# Patient Record
Sex: Female | Born: 1945
Health system: Southern US, Community
[De-identification: ages and names within clinical notes are randomized; demographics above are authoritative.]

## PROBLEM LIST (undated history)

## (undated) DIAGNOSIS — R002 Palpitations: Secondary | ICD-10-CM

## (undated) DIAGNOSIS — K219 Gastro-esophageal reflux disease without esophagitis: Secondary | ICD-10-CM

## (undated) DIAGNOSIS — M810 Age-related osteoporosis without current pathological fracture: Secondary | ICD-10-CM

## (undated) HISTORY — DX: Age-related osteoporosis without current pathological fracture: M81.0

## (undated) HISTORY — DX: Gastro-esophageal reflux disease without esophagitis: K21.9

## (undated) HISTORY — DX: Palpitations: R00.2

---

## 1998-10-12 ENCOUNTER — Other Ambulatory Visit: Admission: RE | Admit: 1998-10-12 | Discharge: 1998-10-12 | Payer: Self-pay | Admitting: Gynecology

## 1999-10-21 ENCOUNTER — Other Ambulatory Visit: Admission: RE | Admit: 1999-10-21 | Discharge: 1999-10-21 | Payer: Self-pay | Admitting: Gynecology

## 2000-10-31 ENCOUNTER — Other Ambulatory Visit: Admission: RE | Admit: 2000-10-31 | Discharge: 2000-10-31 | Payer: Self-pay | Admitting: Gynecology

## 2002-08-01 ENCOUNTER — Other Ambulatory Visit: Admission: RE | Admit: 2002-08-01 | Discharge: 2002-08-01 | Payer: Self-pay | Admitting: *Deleted

## 2003-07-21 ENCOUNTER — Other Ambulatory Visit: Admission: RE | Admit: 2003-07-21 | Discharge: 2003-07-21 | Payer: Self-pay

## 2004-07-20 ENCOUNTER — Other Ambulatory Visit: Admission: RE | Admit: 2004-07-20 | Discharge: 2004-07-20 | Payer: Self-pay | Admitting: Obstetrics and Gynecology

## 2005-11-09 ENCOUNTER — Other Ambulatory Visit: Admission: RE | Admit: 2005-11-09 | Discharge: 2005-11-09 | Payer: Self-pay | Admitting: Obstetrics and Gynecology

## 2006-12-04 ENCOUNTER — Other Ambulatory Visit: Admission: RE | Admit: 2006-12-04 | Discharge: 2006-12-04 | Payer: Self-pay | Admitting: Obstetrics and Gynecology

## 2007-12-05 ENCOUNTER — Other Ambulatory Visit: Admission: RE | Admit: 2007-12-05 | Discharge: 2007-12-05 | Payer: Self-pay | Admitting: Obstetrics and Gynecology

## 2010-03-30 ENCOUNTER — Ambulatory Visit: Payer: Self-pay | Admitting: Cardiology

## 2010-09-05 ENCOUNTER — Encounter: Payer: Self-pay | Admitting: Cardiology

## 2015-09-23 DIAGNOSIS — M810 Age-related osteoporosis without current pathological fracture: Secondary | ICD-10-CM | POA: Diagnosis not present

## 2015-09-23 DIAGNOSIS — Z0000 Encounter for general adult medical examination without abnormal findings: Secondary | ICD-10-CM | POA: Diagnosis not present

## 2015-09-29 DIAGNOSIS — Z13220 Encounter for screening for lipoid disorders: Secondary | ICD-10-CM | POA: Diagnosis not present

## 2015-09-29 DIAGNOSIS — M810 Age-related osteoporosis without current pathological fracture: Secondary | ICD-10-CM | POA: Diagnosis not present

## 2015-09-29 DIAGNOSIS — E559 Vitamin D deficiency, unspecified: Secondary | ICD-10-CM | POA: Diagnosis not present

## 2015-09-29 DIAGNOSIS — Z0000 Encounter for general adult medical examination without abnormal findings: Secondary | ICD-10-CM | POA: Diagnosis not present

## 2015-09-29 DIAGNOSIS — N390 Urinary tract infection, site not specified: Secondary | ICD-10-CM | POA: Diagnosis not present

## 2015-10-06 DIAGNOSIS — Z23 Encounter for immunization: Secondary | ICD-10-CM | POA: Diagnosis not present

## 2016-04-27 DIAGNOSIS — Z23 Encounter for immunization: Secondary | ICD-10-CM | POA: Diagnosis not present

## 2016-04-27 DIAGNOSIS — R490 Dysphonia: Secondary | ICD-10-CM | POA: Diagnosis not present

## 2016-07-22 DIAGNOSIS — Z1231 Encounter for screening mammogram for malignant neoplasm of breast: Secondary | ICD-10-CM | POA: Diagnosis not present

## 2016-07-22 DIAGNOSIS — Z803 Family history of malignant neoplasm of breast: Secondary | ICD-10-CM | POA: Diagnosis not present

## 2016-08-04 DIAGNOSIS — L661 Lichen planopilaris: Secondary | ICD-10-CM | POA: Diagnosis not present

## 2016-08-04 DIAGNOSIS — L814 Other melanin hyperpigmentation: Secondary | ICD-10-CM | POA: Diagnosis not present

## 2016-08-04 DIAGNOSIS — L570 Actinic keratosis: Secondary | ICD-10-CM | POA: Diagnosis not present

## 2016-08-24 DIAGNOSIS — K219 Gastro-esophageal reflux disease without esophagitis: Secondary | ICD-10-CM | POA: Diagnosis not present

## 2016-08-24 DIAGNOSIS — R490 Dysphonia: Secondary | ICD-10-CM | POA: Diagnosis not present

## 2016-08-24 DIAGNOSIS — J310 Chronic rhinitis: Secondary | ICD-10-CM | POA: Diagnosis not present

## 2016-09-06 DIAGNOSIS — J310 Chronic rhinitis: Secondary | ICD-10-CM | POA: Diagnosis not present

## 2016-10-05 DIAGNOSIS — E559 Vitamin D deficiency, unspecified: Secondary | ICD-10-CM | POA: Diagnosis not present

## 2016-10-05 DIAGNOSIS — R490 Dysphonia: Secondary | ICD-10-CM | POA: Diagnosis not present

## 2016-10-05 DIAGNOSIS — K219 Gastro-esophageal reflux disease without esophagitis: Secondary | ICD-10-CM | POA: Diagnosis not present

## 2016-10-05 DIAGNOSIS — Z0000 Encounter for general adult medical examination without abnormal findings: Secondary | ICD-10-CM | POA: Diagnosis not present

## 2016-10-07 ENCOUNTER — Ambulatory Visit (INDEPENDENT_AMBULATORY_CARE_PROVIDER_SITE_OTHER): Payer: PPO | Admitting: Obstetrics & Gynecology

## 2016-10-07 ENCOUNTER — Encounter: Payer: Self-pay | Admitting: Obstetrics & Gynecology

## 2016-10-07 VITALS — BP 110/76 | HR 68 | Resp 12 | Ht 64.0 in | Wt 148.4 lb

## 2016-10-07 DIAGNOSIS — M8580 Other specified disorders of bone density and structure, unspecified site: Secondary | ICD-10-CM

## 2016-10-07 DIAGNOSIS — Z01419 Encounter for gynecological examination (general) (routine) without abnormal findings: Secondary | ICD-10-CM

## 2016-10-07 DIAGNOSIS — Z124 Encounter for screening for malignant neoplasm of cervix: Secondary | ICD-10-CM | POA: Diagnosis not present

## 2016-10-07 NOTE — Progress Notes (Signed)
71 y.o. G2P2 MarriedCaucasianF here for annual exam.  Former patient of Dr. Joan Flores.  Has been having her pelvic exams with her PCP--Dr. Shelia Media or Valerie Galvan in his office.  Denies any issues or concerns.  Denies vaginal bleeding.  Had blood work done last week with Dr. Pennie Banter office.  Daughter had baby in January.  They live in [place].  No LMP recorded. Patient is postmenopausal.          Sexually active: No.  The current method of family planning is post menopausal status.    Exercising: Yes.    walking, yoga Smoker:  Former smoker   Health Maintenance: Pap: 2 years ago at Dr. Pennie Banter office  History of abnormal Pap:  no MMG:  2018 at [facility]  Colonoscopy: patient states she is due.  Had appt with Dr. Earlean Shawl but rescheduled due daughter's pregnacy BMD:   ~2 years ago at Dr. Pennie Banter office  TDaP:  Valerie Galvan, will check with PCP  Pneumonia vaccine(s):  PCP Zostavax:   PCP Hep C testing: will check with PCP  Screening Labs: PCP, Hb today: PCP   reports that she has quit smoking. Her smoking use included Cigarettes. She has never used smokeless tobacco. She reports that she drinks alcohol. She reports that she does not use drugs.  Past Medical History:  Diagnosis Date  . Heart palpitations   . Osteoporosis     History reviewed. No pertinent surgical history.  Current Outpatient Prescriptions  Medication Sig Dispense Refill  . BIOTIN PO Take by mouth daily.    Marland Kitchen CALCIUM-VITAMIN D PO Take by mouth daily.    . Multiple Vitamins-Minerals (MULTIVITAMIN PO) Take by mouth daily.    . pantoprazole (PROTONIX) 40 MG tablet     . Probiotic Product (PROBIOTIC PO) Take by mouth daily.     No current facility-administered medications for this visit.     Family History  Problem Relation Age of Onset  . Valvular heart disease Mother   . Breast cancer Mother 23    diagnosed again in her 37's  . Heart disease Father   . Rheum arthritis Father   . Skin cancer Brother   . Cancer Paternal  Grandmother     tongue     ROS:  Pertinent items are noted in HPI.  Otherwise, a comprehensive ROS was negative.  Exam:   BP 110/76 (BP Location: Right Arm, Patient Position: Sitting, Cuff Size: Normal)   Pulse 68   Resp 12   Ht 5\' 4"  (1.626 m)   Wt 148 lb 6.4 oz (67.3 kg)   BMI 25.47 kg/m    Height: 5\' 4"  (162.6 cm)  Ht Readings from Last 3 Encounters:  10/07/16 5\' 4"  (1.626 m)    General appearance: alert, cooperative and appears stated age Head: Normocephalic, without obvious abnormality, atraumatic Neck: no adenopathy, supple, symmetrical, trachea midline and thyroid normal to inspection and palpation Lungs: clear to auscultation bilaterally Breasts: normal appearance, no masses or tenderness Heart: regular rate and rhythm Abdomen: soft, non-tender; bowel sounds normal; no masses,  no organomegaly Extremities: extremities normal, atraumatic, no cyanosis or edema Skin: Skin color, texture, turgor normal. No rashes or lesions Lymph nodes: Cervical, supraclavicular, and axillary nodes normal. No abnormal inguinal nodes palpated Neurologic: Grossly normal   Pelvic: External genitalia:  no lesions              Urethra:  normal appearing urethra with no masses, tenderness or lesions  Bartholins and Skenes: normal                 Vagina: normal appearing vagina with normal color and discharge, no lesions, second degree uterine prolapse and cystocele              Cervix: no lesions              Pap taken: Yes.   Bimanual Exam:  Uterus:  normal size, contour, position, consistency, mobility, non-tender              Adnexa: normal adnexa and no mass, fullness, tenderness               Rectovaginal: Confirms               Anus:  normal sphincter tone, no lesions  Chaperone was present for exam.  A:  Well Woman with normal exam PMP, no HRT Family hx of breast cancer Second degree uterine prolapse and cystocele  P:   Mammogram guidelines reviewed   Doing 3D  mammogram. pap smear updated today Will get vaccines to update records Lab work was done last week.  She will check about having the Hep C testing done return annually or prn

## 2016-10-07 NOTE — Patient Instructions (Signed)
Check about whether you've had the Hepatitis C testing

## 2016-10-10 LAB — IPS PAP SMEAR ONLY

## 2016-10-12 DIAGNOSIS — E7800 Pure hypercholesterolemia, unspecified: Secondary | ICD-10-CM | POA: Diagnosis not present

## 2016-10-12 DIAGNOSIS — M810 Age-related osteoporosis without current pathological fracture: Secondary | ICD-10-CM | POA: Diagnosis not present

## 2016-10-12 DIAGNOSIS — E559 Vitamin D deficiency, unspecified: Secondary | ICD-10-CM | POA: Diagnosis not present

## 2016-10-12 DIAGNOSIS — Z0000 Encounter for general adult medical examination without abnormal findings: Secondary | ICD-10-CM | POA: Diagnosis not present

## 2017-06-07 DIAGNOSIS — Z23 Encounter for immunization: Secondary | ICD-10-CM | POA: Diagnosis not present

## 2017-08-23 DIAGNOSIS — Z1231 Encounter for screening mammogram for malignant neoplasm of breast: Secondary | ICD-10-CM | POA: Diagnosis not present

## 2017-08-23 DIAGNOSIS — Z803 Family history of malignant neoplasm of breast: Secondary | ICD-10-CM | POA: Diagnosis not present

## 2017-09-27 DIAGNOSIS — Z23 Encounter for immunization: Secondary | ICD-10-CM | POA: Diagnosis not present

## 2017-10-11 DIAGNOSIS — E7800 Pure hypercholesterolemia, unspecified: Secondary | ICD-10-CM | POA: Diagnosis not present

## 2017-10-11 DIAGNOSIS — E559 Vitamin D deficiency, unspecified: Secondary | ICD-10-CM | POA: Diagnosis not present

## 2017-10-11 DIAGNOSIS — N390 Urinary tract infection, site not specified: Secondary | ICD-10-CM | POA: Diagnosis not present

## 2017-10-11 DIAGNOSIS — Z0000 Encounter for general adult medical examination without abnormal findings: Secondary | ICD-10-CM | POA: Diagnosis not present

## 2017-11-07 DIAGNOSIS — E559 Vitamin D deficiency, unspecified: Secondary | ICD-10-CM | POA: Diagnosis not present

## 2017-11-07 DIAGNOSIS — M810 Age-related osteoporosis without current pathological fracture: Secondary | ICD-10-CM | POA: Diagnosis not present

## 2017-11-07 DIAGNOSIS — Z0000 Encounter for general adult medical examination without abnormal findings: Secondary | ICD-10-CM | POA: Diagnosis not present

## 2018-01-11 ENCOUNTER — Ambulatory Visit: Payer: PPO | Admitting: Obstetrics & Gynecology

## 2018-03-08 ENCOUNTER — Other Ambulatory Visit: Payer: Self-pay

## 2018-03-08 ENCOUNTER — Ambulatory Visit (INDEPENDENT_AMBULATORY_CARE_PROVIDER_SITE_OTHER): Payer: PPO | Admitting: Obstetrics & Gynecology

## 2018-03-08 ENCOUNTER — Encounter: Payer: Self-pay | Admitting: Obstetrics & Gynecology

## 2018-03-08 VITALS — BP 126/80 | HR 76 | Resp 16 | Ht 64.0 in | Wt 151.0 lb

## 2018-03-08 DIAGNOSIS — Z01419 Encounter for gynecological examination (general) (routine) without abnormal findings: Secondary | ICD-10-CM | POA: Diagnosis not present

## 2018-03-08 NOTE — Progress Notes (Signed)
72 y.o. G2P2 MarriedCaucasianF here for annual exam. Doing well.  Denies vaginal bleeding.    Has been referred to Dr. Earlean Shawl for repeat colonoscopy.    Lab work done early this year with Dr. Milton Ferguson.    Patient's last menstrual period was 08/15/1996 (approximate).          Sexually active: No.  The current method of family planning is post menopausal status.    Exercising: Yes.    walking, yoga Smoker:  no  Health Maintenance: Pap:  10/07/16 neg  History of abnormal Pap:  no MMG:  07/22/16 BIRADS1:Neg  Colonoscopy: ~2000 normal.  Has been recommended BMD:  Unsure.  Will plan to do this with mammogram in December TDaP:  2011 Pneumonia vaccine(s):  2014 Shingrix: Zostavax.   Hep C testing: PCP Screening Labs: PCP   reports that she has quit smoking. Her smoking use included cigarettes. She has never used smokeless tobacco. She reports that she drinks about 2.4 oz of alcohol per week. She reports that she does not use drugs.  Past Medical History:  Diagnosis Date  . Heart palpitations   . Osteoporosis     History reviewed. No pertinent surgical history.  Current Outpatient Medications  Medication Sig Dispense Refill  . BIOTIN PO Take by mouth daily.    Marland Kitchen CALCIUM-VITAMIN D PO Take by mouth daily.    . Cimetidine (ACID REDUCER 200 PO) Take by mouth daily as needed.    . Multiple Vitamins-Minerals (MULTIVITAMIN PO) Take by mouth daily.    . Probiotic Product (PROBIOTIC PO) Take by mouth daily.     No current facility-administered medications for this visit.     Family History  Problem Relation Age of Onset  . Valvular heart disease Mother   . Breast cancer Mother 27       diagnosed again in her 39's  . Heart disease Father   . Rheum arthritis Father   . Skin cancer Brother   . Cancer Paternal Grandmother        tongue     Review of Systems  All other systems reviewed and are negative.   Exam:   BP 126/80 (BP Location: Right Arm, Patient Position: Sitting, Cuff  Size: Normal)   Pulse 76   Resp 16   Ht 5\' 4"  (1.626 m)   Wt 151 lb (68.5 kg)   LMP 08/15/1996 (Approximate)   BMI 25.92 kg/m     Height: 5\' 4"  (162.6 cm)  Ht Readings from Last 3 Encounters:  03/08/18 5\' 4"  (1.626 m)  10/07/16 5\' 4"  (1.626 m)    General appearance: alert, cooperative and appears stated age Head: Normocephalic, without obvious abnormality, atraumatic Neck: no adenopathy, supple, symmetrical, trachea midline and thyroid normal to inspection and palpation Lungs: clear to auscultation bilaterally Breasts: normal appearance, no masses or tenderness Heart: regular rate and rhythm Abdomen: soft, non-tender; bowel sounds normal; no masses,  no organomegaly Extremities: extremities normal, atraumatic, no cyanosis or edema Skin: Skin color, texture, turgor normal. No rashes or lesions Lymph nodes: Cervical, supraclavicular, and axillary nodes normal. No abnormal inguinal nodes palpated Neurologic: Grossly normal   Pelvic: External genitalia:  no lesions              Urethra:  normal appearing urethra with no masses, tenderness or lesions              Bartholins and Skenes: normal  Vagina: normal appearing vagina with normal color and discharge, no lesions              Cervix: no lesions              Pap taken: No. Bimanual Exam:  Uterus:  normal size, contour, position, consistency, mobility, non-tender              Adnexa: normal adnexa and no mass, fullness, tenderness               Rectovaginal: Confirms               Anus:  normal sphincter tone, no lesions  Chaperone was present for exam.  A:  Well Woman with normal exam PMP, no HRT Family hx of breast cancer Second degree uterine prolapse and cystocele  P:   Mammogram guidelines reviewed.  Release of records signed for last MMG. Order sent to [facility] for BMD.  She will do these at the same time. pap smear 2018 new.  Will repeat 1-2 years per pt desires Lab work done in [place] with Dr.  Shelia Media Shingrix vaccinations reviewed.  Declines for now. Return annually or prn

## 2018-06-28 DIAGNOSIS — Z23 Encounter for immunization: Secondary | ICD-10-CM | POA: Diagnosis not present

## 2019-06-25 DIAGNOSIS — Z23 Encounter for immunization: Secondary | ICD-10-CM | POA: Diagnosis not present

## 2019-07-02 ENCOUNTER — Ambulatory Visit: Payer: PPO | Admitting: Obstetrics & Gynecology

## 2019-09-05 ENCOUNTER — Ambulatory Visit: Payer: Self-pay | Attending: Internal Medicine

## 2019-09-05 ENCOUNTER — Ambulatory Visit: Payer: Self-pay

## 2019-09-05 DIAGNOSIS — Z23 Encounter for immunization: Secondary | ICD-10-CM | POA: Insufficient documentation

## 2019-09-05 NOTE — Progress Notes (Signed)
   Covid-19 Vaccination Clinic  Name:  HARRIS MCQUARY    MRN: [id number] DOB: April 20, 1946  09/05/2019  Ms. Frye was observed post Covid-19 immunization for 15 minutes without incidence. She was provided with Vaccine Information Sheet and instruction to access the V-Safe system.   Ms. Rentas was instructed to call 911 with any severe reactions post vaccine: Marland Kitchen Difficulty breathing  . Swelling of your face and throat  . A fast heartbeat  . A bad rash all over your body  . Dizziness and weakness    Immunizations Administered    Name Date Dose VIS Date Route   Pfizer COVID-19 Vaccine 09/05/2019 11:59 AM 0.3 mL 07/26/2019 Intramuscular   Manufacturer: [employer]   Lot: [id number]   [place]: [id number]

## 2019-09-10 DIAGNOSIS — Z1159 Encounter for screening for other viral diseases: Secondary | ICD-10-CM | POA: Diagnosis not present

## 2019-09-10 DIAGNOSIS — E7800 Pure hypercholesterolemia, unspecified: Secondary | ICD-10-CM | POA: Diagnosis not present

## 2019-09-10 DIAGNOSIS — M810 Age-related osteoporosis without current pathological fracture: Secondary | ICD-10-CM | POA: Diagnosis not present

## 2019-09-17 DIAGNOSIS — M810 Age-related osteoporosis without current pathological fracture: Secondary | ICD-10-CM | POA: Diagnosis not present

## 2019-09-17 DIAGNOSIS — E663 Overweight: Secondary | ICD-10-CM | POA: Diagnosis not present

## 2019-09-17 DIAGNOSIS — Z0000 Encounter for general adult medical examination without abnormal findings: Secondary | ICD-10-CM | POA: Diagnosis not present

## 2019-09-17 DIAGNOSIS — J309 Allergic rhinitis, unspecified: Secondary | ICD-10-CM | POA: Diagnosis not present

## 2019-09-26 ENCOUNTER — Ambulatory Visit: Payer: Self-pay | Attending: Internal Medicine

## 2019-09-26 DIAGNOSIS — Z23 Encounter for immunization: Secondary | ICD-10-CM | POA: Insufficient documentation

## 2019-09-26 NOTE — Progress Notes (Signed)
   Covid-19 Vaccination Clinic  Name:  TAWNEY BREITMAN    MRN: [id number] DOB: 05-17-1946  09/26/2019  Ms. Venezia was observed post Covid-19 immunization for 15 minutes without incidence. She was provided with Vaccine Information Sheet and instruction to access the V-Safe system.   Ms. Lonzo was instructed to call 911 with any severe reactions post vaccine: Marland Kitchen Difficulty breathing  . Swelling of your face and throat  . A fast heartbeat  . A bad rash all over your body  . Dizziness and weakness    Immunizations Administered    Name Date Dose VIS Date Route   Pfizer COVID-19 Vaccine 09/26/2019 12:39 PM 0.3 mL 07/26/2019 Intramuscular   Manufacturer: [employer]   Lot: [id number]   [place]: [id number]

## 2019-10-08 DIAGNOSIS — Z1231 Encounter for screening mammogram for malignant neoplasm of breast: Secondary | ICD-10-CM | POA: Diagnosis not present

## 2019-10-10 DIAGNOSIS — M8589 Other specified disorders of bone density and structure, multiple sites: Secondary | ICD-10-CM | POA: Diagnosis not present

## 2019-10-10 DIAGNOSIS — M810 Age-related osteoporosis without current pathological fracture: Secondary | ICD-10-CM | POA: Diagnosis not present

## 2019-10-10 DIAGNOSIS — E7800 Pure hypercholesterolemia, unspecified: Secondary | ICD-10-CM | POA: Diagnosis not present

## 2019-10-22 DIAGNOSIS — H2513 Age-related nuclear cataract, bilateral: Secondary | ICD-10-CM | POA: Diagnosis not present

## 2019-10-22 DIAGNOSIS — H52203 Unspecified astigmatism, bilateral: Secondary | ICD-10-CM | POA: Diagnosis not present

## 2019-10-22 DIAGNOSIS — H40013 Open angle with borderline findings, low risk, bilateral: Secondary | ICD-10-CM | POA: Diagnosis not present

## 2019-10-22 DIAGNOSIS — R69 Illness, unspecified: Secondary | ICD-10-CM | POA: Diagnosis not present

## 2019-10-24 DIAGNOSIS — E7800 Pure hypercholesterolemia, unspecified: Secondary | ICD-10-CM | POA: Diagnosis not present

## 2019-10-24 DIAGNOSIS — E663 Overweight: Secondary | ICD-10-CM | POA: Diagnosis not present

## 2019-11-26 DIAGNOSIS — R300 Dysuria: Secondary | ICD-10-CM | POA: Diagnosis not present

## 2019-11-26 DIAGNOSIS — N390 Urinary tract infection, site not specified: Secondary | ICD-10-CM | POA: Diagnosis not present

## 2019-11-26 DIAGNOSIS — R358 Other polyuria: Secondary | ICD-10-CM | POA: Diagnosis not present

## 2019-12-25 DIAGNOSIS — M810 Age-related osteoporosis without current pathological fracture: Secondary | ICD-10-CM | POA: Insufficient documentation

## 2019-12-26 DIAGNOSIS — Z1211 Encounter for screening for malignant neoplasm of colon: Secondary | ICD-10-CM | POA: Diagnosis not present

## 2020-01-29 DIAGNOSIS — L570 Actinic keratosis: Secondary | ICD-10-CM | POA: Diagnosis not present

## 2020-01-29 DIAGNOSIS — L820 Inflamed seborrheic keratosis: Secondary | ICD-10-CM | POA: Diagnosis not present

## 2020-01-29 DIAGNOSIS — D485 Neoplasm of uncertain behavior of skin: Secondary | ICD-10-CM | POA: Diagnosis not present

## 2020-01-29 DIAGNOSIS — B078 Other viral warts: Secondary | ICD-10-CM | POA: Diagnosis not present

## 2020-01-29 DIAGNOSIS — L814 Other melanin hyperpigmentation: Secondary | ICD-10-CM | POA: Diagnosis not present

## 2020-01-29 DIAGNOSIS — L821 Other seborrheic keratosis: Secondary | ICD-10-CM | POA: Diagnosis not present

## 2020-05-01 NOTE — Progress Notes (Signed)
74 y.o. G2P2 Married Other or two or more races female here for breast and pelvic exam.  I am also following her for incomplete uterine prolapse with cystocele.  She did have cystitis earlier this year.  Does not feel prolapse is worse.    Denies vaginal bleeding.  Patient's last menstrual period was 08/15/1996 (approximate).          Sexually active: No.  H/O STD:  no  Health Maintenance: PCP:  Deland Pretty  Last wellness appt was 08/2019  Did blood work at that appt: yes  Vaccines are up to date:  Has not had Shingrix vaccination Colonoscopy:  12/26/2019, Dr. Earlean Shawl.   MMG:  10-08-2019, waiting on fax BMD:  Done with pcp.  New osteoporosis.   Last pap smear:  10-07-16 neg H/o abnormal pap smear: no    reports that she has quit smoking. Her smoking use included cigarettes. She has never used smokeless tobacco. She reports current alcohol use of about 4.0 standard drinks of alcohol per week. She reports that she does not use drugs.  Past Medical History:  Diagnosis Date  . Heart palpitations   . Laryngopharyngeal reflux   . Osteoporosis     History reviewed. No pertinent surgical history.  Current Outpatient Medications  Medication Sig Dispense Refill  . Ascorbic Acid (VITAMIN C PO) Take by mouth.    Marland Kitchen CALCIUM-VITAMIN D PO Take by mouth daily.    . Cimetidine (ACID REDUCER 200 PO) Take by mouth daily as needed.    . Multiple Vitamins-Minerals (MULTIVITAMIN PO) Take by mouth daily.    . Multiple Vitamins-Minerals (ZINC PO) Take by mouth.    . Probiotic Product (PROBIOTIC PO) Take by mouth daily.    . Vitamin D-Vitamin K (VITAMIN K2-VITAMIN D3 PO) Take by mouth.     No current facility-administered medications for this visit.    Family History  Problem Relation Age of Onset  . Valvular heart disease Mother   . Breast cancer Mother 43       diagnosed again in her 14's  . Heart disease Father   . Rheum arthritis Father   . Skin cancer Brother   . Tongue cancer Paternal  Grandmother             Review of Systems  Constitutional: Negative.   HENT: Negative.   Eyes: Negative.   Respiratory: Negative.   Cardiovascular: Negative.   Gastrointestinal: Negative.   Endocrine: Negative.   Genitourinary: Negative.   Musculoskeletal: Negative.   Skin: Negative.   Allergic/Immunologic: Negative.   Neurological: Negative.   Hematological: Negative.   Psychiatric/Behavioral: Negative.     Exam:   BP 122/80   Pulse 70   Resp 16   Ht 5' 4.25" (1.632 m)   Wt 153 lb (69.4 kg)   LMP 08/15/1996 (Approximate)   BMI 26.06 kg/m   Height: 5' 4.25" (163.2 cm)  General appearance: alert, cooperative and appears stated age Breasts: normal appearance, no masses or tenderness Abdomen: soft, non-tender; bowel sounds normal; no masses,  no organomegaly Lymph nodes: Cervical, supraclavicular, and axillary nodes normal.  No abnormal inguinal nodes palpated Neurologic: Grossly normal  Pelvic: External genitalia:  no lesions              Urethra:  normal appearing urethra with no masses, tenderness or lesions              Bartholins and Skenes: normal  Vagina: normal appearing vagina with normal color and discharge, no lesions              Cervix: no lesions              Pap taken: No. Bimanual Exam:  Uterus:  normal size, contour, position, consistency, mobility, non-tender              Adnexa: normal adnexa and no mass, fullness, tenderness               Rectovaginal: Confirms               Anus:  normal sphincter tone, no lesions  Chaperone, Olene Floss, CMA, was present for exam.  A:  Breast and Pelvic exam PMP, no HRT Second degree uterine prolapse and cystocel  P:   Mammogram guidelines reviewed.  We called for this today. pap smear guidelines reviewed.  We both feel comfortable not doing Pap smears Release signed for colonoscopy done with Dr. Earlean Shawl Lab work is done with Dr. Shelia Media return annually or prn  26 minutes of total time was  spent for this patient encounter, including preparation, face-to-face counseling with the patient and coordination of care, and documentation of the encounter.

## 2020-05-05 ENCOUNTER — Encounter: Payer: Self-pay | Admitting: Obstetrics & Gynecology

## 2020-05-05 ENCOUNTER — Other Ambulatory Visit: Payer: Self-pay

## 2020-05-05 ENCOUNTER — Ambulatory Visit (INDEPENDENT_AMBULATORY_CARE_PROVIDER_SITE_OTHER): Payer: Medicare HMO | Admitting: Obstetrics & Gynecology

## 2020-05-05 VITALS — BP 122/80 | HR 70 | Resp 16 | Ht 64.25 in | Wt 153.0 lb

## 2020-05-05 DIAGNOSIS — Z01419 Encounter for gynecological examination (general) (routine) without abnormal findings: Secondary | ICD-10-CM

## 2020-05-05 DIAGNOSIS — M818 Other osteoporosis without current pathological fracture: Secondary | ICD-10-CM

## 2020-09-16 DIAGNOSIS — E7800 Pure hypercholesterolemia, unspecified: Secondary | ICD-10-CM | POA: Diagnosis not present

## 2020-09-16 DIAGNOSIS — M810 Age-related osteoporosis without current pathological fracture: Secondary | ICD-10-CM | POA: Diagnosis not present

## 2020-09-22 DIAGNOSIS — E559 Vitamin D deficiency, unspecified: Secondary | ICD-10-CM | POA: Diagnosis not present

## 2020-09-22 DIAGNOSIS — E7800 Pure hypercholesterolemia, unspecified: Secondary | ICD-10-CM | POA: Diagnosis not present

## 2020-09-22 DIAGNOSIS — Z0000 Encounter for general adult medical examination without abnormal findings: Secondary | ICD-10-CM | POA: Diagnosis not present

## 2020-09-22 DIAGNOSIS — N1831 Chronic kidney disease, stage 3a: Secondary | ICD-10-CM | POA: Diagnosis not present

## 2020-09-22 DIAGNOSIS — J309 Allergic rhinitis, unspecified: Secondary | ICD-10-CM | POA: Diagnosis not present

## 2020-09-22 DIAGNOSIS — S20419A Abrasion of unspecified back wall of thorax, initial encounter: Secondary | ICD-10-CM | POA: Diagnosis not present

## 2020-09-22 DIAGNOSIS — M810 Age-related osteoporosis without current pathological fracture: Secondary | ICD-10-CM | POA: Diagnosis not present

## 2020-09-22 DIAGNOSIS — Z803 Family history of malignant neoplasm of breast: Secondary | ICD-10-CM | POA: Diagnosis not present

## 2020-09-25 ENCOUNTER — Other Ambulatory Visit: Payer: Self-pay | Admitting: Internal Medicine

## 2020-09-25 DIAGNOSIS — E7800 Pure hypercholesterolemia, unspecified: Secondary | ICD-10-CM

## 2020-12-01 ENCOUNTER — Other Ambulatory Visit: Payer: Self-pay

## 2020-12-01 ENCOUNTER — Ambulatory Visit: Payer: Medicare HMO | Admitting: Sports Medicine

## 2020-12-01 ENCOUNTER — Ambulatory Visit
Admission: RE | Admit: 2020-12-01 | Discharge: 2020-12-01 | Disposition: A | Discharge status: Home / Self Care | Payer: Medicare HMO | Source: Ambulatory Visit | Attending: Sports Medicine | Admitting: Sports Medicine

## 2020-12-01 VITALS — BP 142/68 | Ht 65.0 in | Wt 148.0 lb

## 2020-12-01 DIAGNOSIS — M25511 Pain in right shoulder: Secondary | ICD-10-CM | POA: Diagnosis not present

## 2020-12-01 DIAGNOSIS — M19011 Primary osteoarthritis, right shoulder: Secondary | ICD-10-CM | POA: Diagnosis not present

## 2020-12-01 NOTE — Progress Notes (Signed)
   PCP: Deland Pretty, MD  Subjective:   HPI: Patient is a 75 y.o. female here for shoulder pain.  Right shoulder pain Ms. Fournier reports that she for started to experience right shoulder pain about 8-9 months ago when she experienced a fall on her outstretched arm as a result of the mischief of one of her grandchildren.  She has been having some right shoulder pain since then but thought that it might be similar to her previous frozen shoulder and would simply take time to resolve.  At this time, she primarily notices shoulder discomfort when trying to get her right arm into the sleeve of a jacket or with certain overhead exercises.  She is also noticed that she has some difficulty with a slightly reduced range of motion of her shoulder and her aerobics class.  She describes the pain as a 7/10 discomfort at the back of her shoulder.  Overall, this is a very little impact on her day-to-day life.  She is most interested in diagnosing this problem so she does not do any further injury to herself.  Review of Systems:  Per HPI.   [place], medications and smoking status reviewed.      Objective:  Physical Exam:  No flowsheet data found.   Gen: awake, alert, NAD, comfortable in exam room Pulm: breathing unlabored  Shoulder: Inspection reveals no obvious deformity, atrophy, or asymmetry. No bruising. No swelling Palpation is normal with no TTP over [facility] joint or bicipital groove. Full ROM in flexion, abduction, external rotation.  She is a minor reduction in her active internal rotation of the right shoulder compared to the left. NV intact distally Normal scapular function observed. Special Tests:  - Impingement: Minor discomfort with Hawkins test.  No discomfort with neers or empty can sign. - Supraspinatous: Negative empty can.  5/5 strength with resisted flexion at 20 degrees - Infraspinatous/Teres Minor: 5/5 strength with ER     Assessment & Plan:  1.  Right shoulder pain The  differential at this time is primarily suspicious for osteoarthritis of the shoulder and rotator cuff pathology.  This does not seem to be significantly impacting her life right now and we will not plan to move forward with any aggressive treatment but will at least engage in a full work-up to help provide a diagnosis.  Most likely osteoarthritis likely exacerbated by the fall she mentioned.  We will get shoulder x-rays to assess for the degree of arthritic change.  We will also have her return to clinic for a formal right shoulder ultrasound.  She was provided to exercises to do at home and with her trainer at the gym.   Matilde Haymaker, MD PGY-3 12/01/2020 2:54 PM  Patient seen and evaluated with the resident.  I agree with the above plan of care.  Patient's quality of life does not appear to be affected too much by her right shoulder discomfort but I think we should get some imaging including x-ray and ultrasound.  She will return to the office sometime next week for a complete right shoulder ultrasound and I have asked her to get an AP and axillary x-ray of her right shoulder at [facility] imaging prior to that visit.

## 2020-12-01 NOTE — Patient Instructions (Signed)
Right shoulder pain: It seems like this is most likely related to possible arthritis or may be a rotator cuff issue.  Today, we will start with some x-rays of your shoulder and get you some exercises to do at home and with your instructor at your gym.  We will have you come back to clinic for a ultrasound exam of your shoulder to get additional information.

## 2020-12-08 ENCOUNTER — Ambulatory Visit (INDEPENDENT_AMBULATORY_CARE_PROVIDER_SITE_OTHER): Payer: Medicare HMO | Admitting: Sports Medicine

## 2020-12-08 ENCOUNTER — Other Ambulatory Visit: Payer: Self-pay

## 2020-12-08 ENCOUNTER — Ambulatory Visit: Payer: Self-pay

## 2020-12-08 VITALS — BP 132/70 | Ht 65.0 in | Wt 148.0 lb

## 2020-12-08 DIAGNOSIS — M25511 Pain in right shoulder: Secondary | ICD-10-CM

## 2020-12-08 NOTE — Patient Instructions (Signed)
It was great to see you today! Thank you for letting me participate in your care!  Today, we discussed your right shoulder pain and the ultrasound was negative for any tearing of the rotator cuff. You did have a minimal amount of fluid in the bursa area but this is nothing to worry about.  Please continue your at home exercises but be mindful of your range of motion and be careful not to do exercises that cause a lot of pain and avoid overhead lifting or anything above the shoulder.  Be well, Harolyn Rutherford, DO PGY-4, Sports Medicine Fellow [place]

## 2020-12-09 NOTE — Progress Notes (Addendum)
Patient ID: Valerie Galvan, female   DOB: 08-26-1945, 75 y.o.   MRN: [id number]  Patient presents today for a right shoulder ultrasound.  X-rays of the right shoulder show mild glenohumeral osteoarthritis.  MSK ultrasound of the right shoulder: Biceps tendon was well visualized in both long and short axis.  Subscapularis, supraspinatus, and infraspinatus tendons were well visualized. There are some hypoechoic changes at the footplate of the supraspinatus and infraspinatus tendons consistent with tendinopathy.  No full-thickness tears appreciated.  AC joint shows some mild degenerative changes as well and there is a mild amount of fluid in the subacromial bursa.  Based on her x-ray findings and her bedside ultrasound results, I think her symptoms are likely originating from the mild osteoarthritis seen on x-ray.  We do not see any evidence of full-thickness rotator cuff tearing and her physical exam shows excellent strength and minimal pain with rotator cuff stressing.  She will modify her workouts based on shoulder pain and I have cautioned her about repetitive overhead lifting.  Her symptoms are currently tolerable so we will not pursue any further treatment at this time.  However, if her symptoms worsen or begin to interfere more with her activities of daily living, then we would consider cortisone injection likely into the glenohumeral joint.  She will follow-up as needed.

## 2020-12-09 NOTE — Addendum Note (Signed)
Addended by: Jolinda Croak E on: 12/09/2020 09:45 AM   Modules accepted: Orders

## 2021-01-20 DIAGNOSIS — D485 Neoplasm of uncertain behavior of skin: Secondary | ICD-10-CM | POA: Diagnosis not present

## 2021-01-20 DIAGNOSIS — L989 Disorder of the skin and subcutaneous tissue, unspecified: Secondary | ICD-10-CM | POA: Diagnosis not present

## 2021-02-04 DIAGNOSIS — E559 Vitamin D deficiency, unspecified: Secondary | ICD-10-CM | POA: Diagnosis not present

## 2021-02-04 DIAGNOSIS — E7800 Pure hypercholesterolemia, unspecified: Secondary | ICD-10-CM | POA: Diagnosis not present

## 2021-02-05 ENCOUNTER — Ambulatory Visit
Admission: RE | Admit: 2021-02-05 | Discharge: 2021-02-05 | Disposition: A | Discharge status: Home / Self Care | Payer: Medicare HMO | Source: Ambulatory Visit | Attending: Internal Medicine | Admitting: Internal Medicine

## 2021-02-05 DIAGNOSIS — E7800 Pure hypercholesterolemia, unspecified: Secondary | ICD-10-CM

## 2021-02-11 DIAGNOSIS — E7800 Pure hypercholesterolemia, unspecified: Secondary | ICD-10-CM | POA: Diagnosis not present

## 2021-02-11 DIAGNOSIS — I2584 Coronary atherosclerosis due to calcified coronary lesion: Secondary | ICD-10-CM | POA: Diagnosis not present

## 2021-02-11 DIAGNOSIS — I2510 Atherosclerotic heart disease of native coronary artery without angina pectoris: Secondary | ICD-10-CM | POA: Diagnosis not present

## 2021-03-15 DIAGNOSIS — L988 Other specified disorders of the skin and subcutaneous tissue: Secondary | ICD-10-CM | POA: Diagnosis not present

## 2021-03-15 DIAGNOSIS — D485 Neoplasm of uncertain behavior of skin: Secondary | ICD-10-CM | POA: Diagnosis not present

## 2021-05-07 ENCOUNTER — Encounter: Payer: Self-pay | Admitting: Cardiovascular Disease

## 2021-05-07 ENCOUNTER — Ambulatory Visit: Payer: Medicare HMO | Admitting: Cardiovascular Disease

## 2021-05-07 ENCOUNTER — Other Ambulatory Visit: Payer: Self-pay

## 2021-05-07 VITALS — BP 154/73 | HR 68 | Ht 64.5 in | Wt 146.4 lb

## 2021-05-07 DIAGNOSIS — R931 Abnormal findings on diagnostic imaging of heart and coronary circulation: Secondary | ICD-10-CM | POA: Diagnosis not present

## 2021-05-07 DIAGNOSIS — E7800 Pure hypercholesterolemia, unspecified: Secondary | ICD-10-CM | POA: Diagnosis not present

## 2021-05-07 NOTE — Patient Instructions (Addendum)
Medication Instructions:  No changes *If you need a refill on your cardiac medications before your next appointment, please call your pharmacy*   Lab Work: None ordered If you have labs (blood work) drawn today and your tests are completely normal, you will receive your results only by: [place] (if you have MyChart) OR A paper copy in the mail If you have any lab test that is abnormal or we need to change your treatment, we will call you to review the results.   Testing/Procedures: Your physician has requested that you have an exercise tolerance test. For further information please visit [email or web address]. Please also follow instruction sheet, as given. This will take place at [address], [address]. Do not drink or eat foods with caffeine for 24 hours before the test. (Chocolate, coffee, tea, or energy drinks) If you use an inhaler, bring it with you to the test. Do not smoke for 4 hours before the test. Wear comfortable shoes and clothing.   Follow-Up: At [facility], you and your health needs are our priority.  As part of our continuing mission to provide you with exceptional heart care, we have created designated Provider Care Teams.  These Care Teams include your primary Cardiologist (physician) and Advanced Practice Providers (APPs -  Physician Assistants and Nurse Practitioners) who all work together to provide you with the care you need, when you need it.  We recommend signing up for the patient portal called "MyChart".  Sign up information is provided on this After Visit Summary.  MyChart is used to connect with patients for Virtual Visits (Telemedicine).  Patients are able to view lab/test results, encounter notes, upcoming appointments, etc.  Non-urgent messages can be sent to your provider as well.   To learn more about what you can do with MyChart, go to [email or web address].    Your next appointment:   Follow up as needed with Dr. Sallyanne Kuster

## 2021-05-07 NOTE — Progress Notes (Signed)
Cardiology Office Note:    Date:  05/08/2021   ID:  Mayme, Profeta 1945-10-08, MRN [id number]  PCP:  Deland Pretty, MD   [facility] Providers Cardiologist:  None     Referring MD: Deland Pretty, MD   No chief complaint on file. Valerie Galvan is a 75 y.o. female who is being seen today for the evaluation of  at the request of Deland Pretty, MD.   History of Present Illness:    Valerie Galvan is a 75 y.o. female with a hx of generally excellent health, but with elevated LDL cholesterol who recently underwent a coronary calcium score.  She has no history of clinical CAD or PAD and no family history of heart attack, stroke, heart failure, stents or bypass surgery.  She is active and denies any problems with dyspnea or angina.  She is always paid close attention to a healthy diet.  She denies lower extremity edema or claudication, orthopnea, PND, palpitations, dizziness or syncope.  She has not had any neurological events to suggest stroke or TIA.  She has never smoked (other than a few cigarettes in college).  Her lipid profile from June 2022 showed a total cholesterol 208, HDL 48, LDL 142, triglycerides 90.  In February her LDL was as high as 179.  She does not have diabetes mellitus and has normal renal function.  She does not have high blood pressure.  A coronary calcium score was 428, placing her in the 85th percentile for age and gender.  The majority of the coronary calcification was in the LAD artery.  Past Medical History:  Diagnosis Date   Heart palpitations    Laryngopharyngeal reflux    Osteoporosis     No past surgical history on file.  Current Medications: Current Meds  Medication Sig   Ascorbic Acid (VITAMIN C PO) Take by mouth.   CALCIUM-VITAMIN D PO Take by mouth daily.   Cimetidine (ACID REDUCER 200 PO) Take by mouth daily as needed.   Multiple Vitamins-Minerals (MULTIVITAMIN PO) Take by mouth daily.   Multiple Vitamins-Minerals (ZINC PO) Take by  mouth.   Probiotic Product (PROBIOTIC PO) Take by mouth daily.     Allergies:   Percocet [oxycodone-acetaminophen]   Social History   Socioeconomic History   Marital status: Married    Spouse name: Not on file   Number of children: Not on file   Years of education: Not on file   Highest education level: Not on file  Occupational History   Occupation: teacher  Tobacco Use   Smoking status: Former    Types: Cigarettes   Smokeless tobacco: Never  Vaping Use   Vaping Use: Never used  Substance and Sexual Activity   Alcohol use: Yes    Alcohol/week: 4.0 standard drinks    Types: 4 Glasses of wine per week    Comment: 4 glasses per week    Drug use: No   Sexual activity: Not Currently    Birth control/protection: Post-menopausal  Other Topics Concern   Not on file  Social History Narrative   Not on file   Social Determinants of Health   Financial Resource Strain: Not on file  Food Insecurity: Not on file  Transportation Needs: Not on file  Physical Activity: Not on file  Stress: Not on file  Social Connections: Not on file     Family History: The patient's family history includes Breast cancer (age of onset: 49) in her mother; Heart disease in her  father; Rheum arthritis in her father; Skin cancer in her brother; Tongue cancer in her paternal grandmother; Valvular heart disease in her mother.  ROS:   Please see the history of present illness.     All other systems reviewed and are negative.  EKGs/Labs/Other Studies Reviewed:    The following studies were reviewed today: Notes from PCP and coronary calcium score  EKG:  EKG is  ordered today.  The ekg ordered today demonstrates normal sinus rhythm, normal tracing  Recent Labs: No results found for requested labs within last 8760 hours.  Recent Lipid Panel No results found for: CHOL, TRIG, HDL, CHOLHDL, VLDL, LDLCALC, LDLDIRECT   Risk Assessment/Calculations:           Physical Exam:    VS:  BP (!)  154/73   Pulse 68   Ht 5' 4.5" (1.638 m)   Wt 66.4 kg   LMP 08/15/1996 (Approximate)   SpO2 100%   BMI 24.74 kg/m     Wt Readings from Last 3 Encounters:  05/07/21 66.4 kg  12/08/20 67.1 kg  12/01/20 67.1 kg     GEN:  Well nourished, well developed in no acute distress HEENT: Normal NECK: No JVD; No carotid bruits LYMPHATICS: No lymphadenopathy CARDIAC: RRR, no murmurs, rubs, gallops RESPIRATORY:  Clear to auscultation without rales, wheezing or rhonchi  ABDOMEN: Soft, non-tender, non-distended MUSCULOSKELETAL:  No edema; No deformity  SKIN: Warm and dry NEUROLOGIC:  Alert and oriented x 3 PSYCHIATRIC:  Normal affect   ASSESSMENT:    1. Elevated coronary artery calcium score   2. Hypercholesterolemia    PLAN:    In order of problems listed above:  Elevated coronary calcium score: Asymptomatic.  Normal baseline ECG.  Recommend treadmill stress test. HLP: With a coronary calcium score over 488th percentile, recommend lipid-lowering therapy to achieve LDL cholesterol of 70 or less.  She is already leading a healthy lifestyle and eats a healthy diet.  Likely she will be able to achieve target goals without pharmacological therapy.  Agree with a prescription for rosuvastatin that she has received.  She asked about alternative medications and the relative efficacy and safety.  We reviewed PCSK9 inhibitors and Leqvio, but statins would definitely be the first step.   Shared Decision Making/Informed Consent The risks [chest pain, shortness of breath, cardiac arrhythmias, dizziness, blood pressure fluctuations, myocardial infarction, stroke/transient ischemic attack, and life-threatening complications (estimated to be 1 in 10,000)], benefits (risk stratification, diagnosing coronary artery disease, treatment guidance) and alternatives of an exercise tolerance test were discussed in detail with Valerie Galvan and she agrees to proceed.    Medication Adjustments/Labs and Tests  Ordered: Current medicines are reviewed at length with the patient today.  Concerns regarding medicines are outlined above.  Orders Placed This Encounter  Procedures   Cardiac Stress Test: Informed Consent Details: Physician/Practitioner Attestation; Transcribe to consent form and obtain patient signature   Exercise Tolerance Test   No orders of the defined types were placed in this encounter.   Patient Instructions  Medication Instructions:  No changes *If you need a refill on your cardiac medications before your next appointment, please call your pharmacy*   Lab Work: None ordered If you have labs (blood work) drawn today and your tests are completely normal, you will receive your results only by: [place] (if you have MyChart) OR A paper copy in the mail If you have any lab test that is abnormal or we need to change your treatment, we will  call you to review the results.   Testing/Procedures: Your physician has requested that you have an exercise tolerance test. For further information please visit [email or web address]. Please also follow instruction sheet, as given. This will take place at [address], [address]. Do not drink or eat foods with caffeine for 24 hours before the test. (Chocolate, coffee, tea, or energy drinks) If you use an inhaler, bring it with you to the test. Do not smoke for 4 hours before the test. Wear comfortable shoes and clothing.   Follow-Up: At [facility], you and your health needs are our priority.  As part of our continuing mission to provide you with exceptional heart care, we have created designated Provider Care Teams.  These Care Teams include your primary Cardiologist (physician) and Advanced Practice Providers (APPs -  Physician Assistants and Nurse Practitioners) who all work together to provide you with the care you need, when you need it.  We recommend signing up for the patient portal called "MyChart".  Sign up information  is provided on this After Visit Summary.  MyChart is used to connect with patients for Virtual Visits (Telemedicine).  Patients are able to view lab/test results, encounter notes, upcoming appointments, etc.  Non-urgent messages can be sent to your provider as well.   To learn more about what you can do with MyChart, go to [email or web address].    Your next appointment:   Follow up as needed with Dr. Sallyanne Kuster   Signed, Sanda Klein, MD  05/08/2021 6:11 AM    [place]

## 2021-05-10 DIAGNOSIS — D1801 Hemangioma of skin and subcutaneous tissue: Secondary | ICD-10-CM | POA: Diagnosis not present

## 2021-05-10 DIAGNOSIS — L570 Actinic keratosis: Secondary | ICD-10-CM | POA: Diagnosis not present

## 2021-05-10 DIAGNOSIS — D235 Other benign neoplasm of skin of trunk: Secondary | ICD-10-CM | POA: Diagnosis not present

## 2021-05-10 DIAGNOSIS — L821 Other seborrheic keratosis: Secondary | ICD-10-CM | POA: Diagnosis not present

## 2021-05-13 ENCOUNTER — Telehealth (HOSPITAL_COMMUNITY): Payer: Self-pay | Admitting: *Deleted

## 2021-05-13 NOTE — Telephone Encounter (Signed)
Close encounter 

## 2021-05-13 NOTE — Addendum Note (Signed)
Addended by: Hinton Dyer on: 05/13/2021 02:49 PM   Modules accepted: Orders

## 2021-05-14 ENCOUNTER — Other Ambulatory Visit: Payer: Self-pay

## 2021-05-14 ENCOUNTER — Ambulatory Visit (HOSPITAL_COMMUNITY)
Admission: RE | Admit: 2021-05-14 | Discharge: 2021-05-14 | Disposition: A | Discharge status: Home / Self Care | Payer: Medicare HMO | Source: Ambulatory Visit | Attending: Cardiology | Admitting: Cardiology

## 2021-05-14 DIAGNOSIS — R931 Abnormal findings on diagnostic imaging of heart and coronary circulation: Secondary | ICD-10-CM | POA: Diagnosis not present

## 2021-05-14 LAB — EXERCISE TOLERANCE TEST
Angina Index: 0
Duke Treadmill Score: 9
Estimated workload: 10.1
Exercise duration (min): 9 min
Exercise duration (sec): 0 s
MPHR: 146 {beats}/min
Peak HR: 162 {beats}/min
Percent HR: 110 %
Rest HR: 83 {beats}/min
ST Depression (mm): 0 mm

## 2021-06-02 DIAGNOSIS — E7800 Pure hypercholesterolemia, unspecified: Secondary | ICD-10-CM | POA: Diagnosis not present

## 2021-06-02 DIAGNOSIS — I2510 Atherosclerotic heart disease of native coronary artery without angina pectoris: Secondary | ICD-10-CM | POA: Diagnosis not present

## 2021-06-09 ENCOUNTER — Telehealth: Payer: Self-pay | Admitting: Cardiovascular Disease

## 2021-06-09 DIAGNOSIS — I2584 Coronary atherosclerosis due to calcified coronary lesion: Secondary | ICD-10-CM | POA: Diagnosis not present

## 2021-06-09 DIAGNOSIS — I2510 Atherosclerotic heart disease of native coronary artery without angina pectoris: Secondary | ICD-10-CM | POA: Diagnosis not present

## 2021-06-09 NOTE — Telephone Encounter (Signed)
Patient would like someone to call her and go over the results of the treadmill test she had done with her

## 2021-06-09 NOTE — Telephone Encounter (Signed)
Left a message for the patient to call back.  

## 2021-06-09 NOTE — Telephone Encounter (Signed)
Left a message for the patient to call back.   Pixie Casino, MD  05/14/2021  4:52 PM EDT     Negative adequate stress test   Dr Lemmie Evens (for DR C)

## 2021-06-14 NOTE — Telephone Encounter (Signed)
Left a message for the patient to call back.  

## 2021-06-17 ENCOUNTER — Ambulatory Visit: Payer: Medicare HMO | Admitting: Sports Medicine

## 2021-06-24 DIAGNOSIS — B029 Zoster without complications: Secondary | ICD-10-CM | POA: Diagnosis not present

## 2021-07-01 ENCOUNTER — Ambulatory Visit: Payer: Medicare HMO | Admitting: Sports Medicine

## 2021-07-01 VITALS — BP 123/63 | Ht 65.0 in | Wt 142.0 lb

## 2021-07-01 DIAGNOSIS — M25511 Pain in right shoulder: Secondary | ICD-10-CM

## 2021-07-01 MED ORDER — METHYLPREDNISOLONE ACETATE 40 MG/ML IJ SUSP
40.0000 mg | Freq: Once | INTRAMUSCULAR | Status: AC
  Administered 2021-07-01: 09:00:00 40 mg via INTRA_ARTICULAR

## 2021-07-01 NOTE — Progress Notes (Signed)
   Subjective:    Patient ID: Valerie Galvan, female    DOB: May 14, 1946, 75 y.o.   MRN: [id number]  HPI  Danesha presents today with persistent right shoulder pain.  She was last seen in the office in April.  X-rays at that time showed some mild glenohumeral DJD.  Bedside ultrasound did not show any evidence of significant rotator cuff tearing.  She is continue to struggle with her right shoulder especially with internal rotation.  She does endorse some pain at night as well.  She is here today to discuss next steps in treatment.    Review of Systems As above    Objective:   Physical Exam  Well-developed, well-nourished.  No acute distress  Right shoulder: Patient has good active forward flexion and external rotation.  Some limited abduction primarily secondary to pain.  She does have nearly full internal rotation but it is painful.  She has full passive external rotation bilaterally.  Rotator cuff strength is 5/5 and does not reproduce pain.  Minimal pain with Hawkins and empty can testing.  Neurovascularly intact distally.      Assessment & Plan:   Right shoulder pain likely secondary to mild glenohumeral DJD  I discussed options including further diagnostic imaging versus cortisone injection.  Shareena would like to proceed with cortisone injection today.  Patient's glenohumeral joint was injected after risks and benefits were explained.  She tolerates this without difficulty.  If symptoms persist despite today's injection, she will notify me and we will consider further diagnostic imaging.  Follow-up for ongoing or recalcitrant issues.  Consent obtained and verified. Time-out conducted. Noted no overlying erythema, induration, or other signs of local infection. Skin prepped in a sterile fashion. Topical analgesic spray: Ethyl chloride. Joint: Right shoulder, glenohumeral Needle: 25-gauge 1.5 inch Completed without difficulty. Meds: 3 cc 1% Xylocaine, 1 cc (40 mg)  Depo-Medrol  Advised to call if fevers/chills, erythema, induration, drainage, or persistent bleeding.   This note was dictated using Dragon naturally speaking software and may contain errors in syntax, spelling, or content which have not been identified prior to signing this note.

## 2021-10-25 DIAGNOSIS — M810 Age-related osteoporosis without current pathological fracture: Secondary | ICD-10-CM | POA: Diagnosis not present

## 2021-11-23 ENCOUNTER — Encounter: Payer: Self-pay | Admitting: Sports Medicine

## 2021-11-30 ENCOUNTER — Ambulatory Visit: Payer: Medicare HMO | Admitting: Sports Medicine

## 2021-11-30 VITALS — BP 102/76 | Ht 65.0 in | Wt 142.0 lb

## 2021-11-30 DIAGNOSIS — B078 Other viral warts: Secondary | ICD-10-CM | POA: Diagnosis not present

## 2021-11-30 DIAGNOSIS — M25551 Pain in right hip: Secondary | ICD-10-CM | POA: Diagnosis not present

## 2021-11-30 DIAGNOSIS — L570 Actinic keratosis: Secondary | ICD-10-CM | POA: Diagnosis not present

## 2021-11-30 NOTE — Patient Instructions (Addendum)
We are going to set you up with Dr. Kathrin Penner osteoporosis clinic. ?These clinics are once a month. ?You will be contacted to schedule an appt. ? ?[facility] ?[address] ?[phone] ? ?Please follow up with [place] in 6 weeks. ?

## 2021-12-01 NOTE — Progress Notes (Signed)
? ?  Subjective:  ? ? Patient ID: Valerie Galvan, female    DOB: [birth year], 76 y.o.   MRN: [id number] ? ?HPI chief complaint: Right hip pain ? ?[name] presents today to discuss a couple of different things.  First complaint is lateral right hip pain that has been present for about 6 months.  No trauma that she can recall.  Pain is worse with ambulation as well as with sleeping on the right side at night.  She denies any groin pain.  No specific treatment to date.  She is also here to discuss recent DEXA scan results.  This study was ordered by her PCP and shows osteopenia in the hip and osteoporosis in her spine.  She is interested in education on current treatment for osteoporosis.  She was on Fosamax in the past but discontinued it out of concern for osteonecrosis.  She is currently taking calcium and vitamin D. ? ?Interim medical history reviewed ?Medications reviewed ?Allergies reviewed ? ?Review of Systems ?As above ?   ?Objective:  ? Physical Exam ? ?Well-developed, well-nourished.  No acute distress ? ?Right hip: Smooth painless hip range of motion with a negative logroll.  Minimal tenderness to palpation along the posterior aspect of the greater trochanter with a positive Trendelenburg on the right.  Negative Trendelenburg on the left.  She has hip abductor weakness on the right (4+/5) compared to 5/5 on the left.  She is neurovascularly intact distally.  Ambulating without a limp. ? ? ?   ?Assessment & Plan:  ? ?Right hip pain secondary to greater trochanteric pain syndrome and pelvic stabilizer weakness ?Osteoporosis ? ?Home exercises for her right hip pain consisting of hip abductor strengthening and pelvic stabilizer exercises.  Follow-up with me in 6 weeks for reevaluation of this.  In the meantime, I will refer the patient to Dr. Kathrin Penner osteoporosis clinic to discuss treatment for this. ? ?This note was dictated using Dragon naturally speaking software and may contain errors in syntax, spelling, or  content which have not been identified prior to signing this note.  ?

## 2021-12-06 DIAGNOSIS — M810 Age-related osteoporosis without current pathological fracture: Secondary | ICD-10-CM | POA: Diagnosis not present

## 2021-12-06 DIAGNOSIS — I2584 Coronary atherosclerosis due to calcified coronary lesion: Secondary | ICD-10-CM | POA: Diagnosis not present

## 2021-12-06 DIAGNOSIS — E7800 Pure hypercholesterolemia, unspecified: Secondary | ICD-10-CM | POA: Diagnosis not present

## 2021-12-13 DIAGNOSIS — I700 Atherosclerosis of aorta: Secondary | ICD-10-CM | POA: Diagnosis not present

## 2021-12-13 DIAGNOSIS — H9319 Tinnitus, unspecified ear: Secondary | ICD-10-CM | POA: Diagnosis not present

## 2021-12-13 DIAGNOSIS — J309 Allergic rhinitis, unspecified: Secondary | ICD-10-CM | POA: Diagnosis not present

## 2021-12-13 DIAGNOSIS — M810 Age-related osteoporosis without current pathological fracture: Secondary | ICD-10-CM | POA: Diagnosis not present

## 2021-12-13 DIAGNOSIS — Z0000 Encounter for general adult medical examination without abnormal findings: Secondary | ICD-10-CM | POA: Diagnosis not present

## 2021-12-13 DIAGNOSIS — Z7184 Encounter for health counseling related to travel: Secondary | ICD-10-CM | POA: Diagnosis not present

## 2021-12-13 DIAGNOSIS — I2510 Atherosclerotic heart disease of native coronary artery without angina pectoris: Secondary | ICD-10-CM | POA: Diagnosis not present

## 2021-12-30 DIAGNOSIS — Z1231 Encounter for screening mammogram for malignant neoplasm of breast: Secondary | ICD-10-CM | POA: Diagnosis not present

## 2022-01-11 ENCOUNTER — Ambulatory Visit: Payer: Medicare HMO | Admitting: Sports Medicine

## 2022-03-30 DIAGNOSIS — H16041 Marginal corneal ulcer, right eye: Secondary | ICD-10-CM | POA: Diagnosis not present

## 2022-03-30 DIAGNOSIS — H2513 Age-related nuclear cataract, bilateral: Secondary | ICD-10-CM | POA: Diagnosis not present

## 2022-03-30 DIAGNOSIS — H353131 Nonexudative age-related macular degeneration, bilateral, early dry stage: Secondary | ICD-10-CM | POA: Diagnosis not present

## 2022-03-30 DIAGNOSIS — H52203 Unspecified astigmatism, bilateral: Secondary | ICD-10-CM | POA: Diagnosis not present

## 2022-04-05 DIAGNOSIS — H16041 Marginal corneal ulcer, right eye: Secondary | ICD-10-CM | POA: Diagnosis not present

## 2022-05-03 DIAGNOSIS — N3000 Acute cystitis without hematuria: Secondary | ICD-10-CM | POA: Diagnosis not present

## 2022-05-03 DIAGNOSIS — R3589 Other polyuria: Secondary | ICD-10-CM | POA: Diagnosis not present

## 2022-05-31 DIAGNOSIS — D485 Neoplasm of uncertain behavior of skin: Secondary | ICD-10-CM | POA: Diagnosis not present

## 2022-05-31 DIAGNOSIS — B078 Other viral warts: Secondary | ICD-10-CM | POA: Diagnosis not present

## 2022-05-31 DIAGNOSIS — L814 Other melanin hyperpigmentation: Secondary | ICD-10-CM | POA: Diagnosis not present

## 2022-05-31 DIAGNOSIS — L570 Actinic keratosis: Secondary | ICD-10-CM | POA: Diagnosis not present

## 2022-05-31 DIAGNOSIS — L821 Other seborrheic keratosis: Secondary | ICD-10-CM | POA: Diagnosis not present

## 2022-06-21 DIAGNOSIS — M810 Age-related osteoporosis without current pathological fracture: Secondary | ICD-10-CM | POA: Diagnosis not present

## 2022-06-21 DIAGNOSIS — E559 Vitamin D deficiency, unspecified: Secondary | ICD-10-CM | POA: Diagnosis not present

## 2022-06-21 DIAGNOSIS — R5383 Other fatigue: Secondary | ICD-10-CM | POA: Diagnosis not present

## 2022-06-30 DIAGNOSIS — M810 Age-related osteoporosis without current pathological fracture: Secondary | ICD-10-CM | POA: Diagnosis not present

## 2022-07-15 DEATH — deceased

## 2022-08-30 DIAGNOSIS — R69 Illness, unspecified: Secondary | ICD-10-CM | POA: Diagnosis not present

## 2022-08-30 DIAGNOSIS — Z0100 Encounter for examination of eyes and vision without abnormal findings: Secondary | ICD-10-CM | POA: Diagnosis not present

## 2022-11-08 ENCOUNTER — Ambulatory Visit: Payer: Medicare HMO | Admitting: Sports Medicine

## 2022-11-08 VITALS — BP 128/86 | Ht 65.25 in | Wt 145.0 lb

## 2022-11-08 DIAGNOSIS — G8929 Other chronic pain: Secondary | ICD-10-CM | POA: Diagnosis not present

## 2022-11-08 DIAGNOSIS — M25561 Pain in right knee: Secondary | ICD-10-CM | POA: Diagnosis not present

## 2022-11-08 NOTE — Progress Notes (Cosign Needed)
PCP: Valerie Pretty, MD  Subjective:   HPI: Patient is a 77 y.o. female here for right knee pain.  Patient has had intermittent right lateral knee pain for about 9 months. First started when she was in [place], she slid back while on a hill to take a photo and caused her to kneel on the knee.  She has sometimes felt instability in the patella.  She has noticed a small area of swelling in the lateral anterior aspect of the joint. Pain is aggravated by activity. About 2 to 3 weeks ago, she had a flareup of knee pain after walking 3 miles.  Since then, pain is significantly improved and she is not having any pain currently.  She plays golf and did have some discomfort in the knee this past weekend by the time she was on the 9th hole. She has not tried any OTC medications. She has been using an OTC knee sleeve fitted backwards which has been helpful. Denies joint stiffness, locking, catching.   Past Medical History:  Diagnosis Date   Heart palpitations    Laryngopharyngeal reflux    Osteoporosis     Current Outpatient Medications on File Prior to Visit  Medication Sig Dispense Refill   Ascorbic Acid (VITAMIN C PO) Take by mouth.     CALCIUM-VITAMIN D PO Take by mouth daily.     Cimetidine (ACID REDUCER 200 PO) Take by mouth daily as needed.     Multiple Vitamins-Minerals (MULTIVITAMIN PO) Take by mouth daily.     Multiple Vitamins-Minerals (ZINC PO) Take by mouth.     Probiotic Product (PROBIOTIC PO) Take by mouth daily.     Vitamin D-Vitamin K (VITAMIN K2-VITAMIN D3 PO) Take by mouth.     No current facility-administered medications on file prior to visit.    No past surgical history on file.  Allergies  Allergen Reactions   Percocet [Oxycodone-Acetaminophen] Nausea And Vomiting    Social History   Socioeconomic History   Marital status: Married    Spouse name: Not on file   Number of children: Not on file   Years of education: Not on file   Highest education level: Not  on file  Occupational History   Occupation: teacher  Tobacco Use   Smoking status: Former    Types: Cigarettes   Smokeless tobacco: Never  Vaping Use   Vaping Use: Never used  Substance and Sexual Activity   Alcohol use: Yes    Alcohol/week: 4.0 standard drinks of alcohol    Types: 4 Glasses of wine per week    Comment: 4 glasses per week    Drug use: No   Sexual activity: Not Currently    Birth control/protection: Post-menopausal  Other Topics Concern   Not on file  Social History Narrative   Not on file   Social Determinants of Health   Financial Resource Strain: Not on file  Food Insecurity: Not on file  Transportation Needs: Not on file  Physical Activity: Not on file  Stress: Not on file  Social Connections: Not on file  Intimate Partner Violence: Not on file    Family History  Problem Relation Age of Onset   Valvular heart disease Mother    Breast cancer Mother 85       diagnosed again in her 1's   Heart disease Father    Rheum arthritis Father    Skin cancer Brother    Tongue cancer Paternal Grandmother  BP 128/86   Ht 5' 5.25" (1.657 m)   Wt 145 lb (65.8 kg)   LMP 08/15/1996 (Approximate)   BMI 23.94 kg/m   Review of Systems: See HPI above.     Objective:  Physical Exam:  Gen: awake, alert, NAD, comfortable in exam room Pulm: breathing unlabored  Right knee: No obvious deformity.  There is a small area of effusion to the lateral aspect of the joint. No joint line tenderness. Full ROM with flexion and extension.  Crepitus noted. 5/5 strength with resisted flexion and extension. Negative varus/valgus stress.  Negative anterior and posterior drawer.  Negative McMurray.  Negative Thessaly. 2+ PT pulse.   Assessment & Plan:  1. Chronic right knee pain  Chronic intermittent right knee pain exacerbated by activity with recent flareup, now improved.  Suspect this is likely related to osteoarthritis, possible patellofemoral involvement.   Based on exam meniscal tear is unlikely.  Will obtain imaging given chronicity of pain.  Treat conservatively for now. - HEP hip and quad strengthening - XR right knee - knee compression sleeve provided - can take OTC acetaminophen and/or NSAIDs as needed  Valerie Button, MD Valerie Galvan, PGY-3

## 2022-11-09 ENCOUNTER — Ambulatory Visit
Admission: RE | Admit: 2022-11-09 | Discharge: 2022-11-09 | Disposition: A | Discharge status: Home / Self Care | Payer: Medicare HMO | Source: Ambulatory Visit | Attending: Sports Medicine | Admitting: Sports Medicine

## 2022-11-09 DIAGNOSIS — G8929 Other chronic pain: Secondary | ICD-10-CM

## 2022-11-09 DIAGNOSIS — M25561 Pain in right knee: Secondary | ICD-10-CM | POA: Diagnosis not present

## 2022-11-09 NOTE — Addendum Note (Signed)
Addended by: Jolinda Croak E on: 11/09/2022 09:20 AM   Modules accepted: Orders

## 2022-11-21 ENCOUNTER — Encounter: Payer: Self-pay | Admitting: Sports Medicine

## 2022-12-13 DIAGNOSIS — E7800 Pure hypercholesterolemia, unspecified: Secondary | ICD-10-CM | POA: Diagnosis not present

## 2022-12-13 DIAGNOSIS — M810 Age-related osteoporosis without current pathological fracture: Secondary | ICD-10-CM | POA: Diagnosis not present

## 2022-12-16 DIAGNOSIS — M810 Age-related osteoporosis without current pathological fracture: Secondary | ICD-10-CM | POA: Diagnosis not present

## 2022-12-16 DIAGNOSIS — J309 Allergic rhinitis, unspecified: Secondary | ICD-10-CM | POA: Diagnosis not present

## 2022-12-16 DIAGNOSIS — R490 Dysphonia: Secondary | ICD-10-CM | POA: Diagnosis not present

## 2022-12-16 DIAGNOSIS — I700 Atherosclerosis of aorta: Secondary | ICD-10-CM | POA: Diagnosis not present

## 2022-12-16 DIAGNOSIS — Z0000 Encounter for general adult medical examination without abnormal findings: Secondary | ICD-10-CM | POA: Diagnosis not present

## 2022-12-16 DIAGNOSIS — I2510 Atherosclerotic heart disease of native coronary artery without angina pectoris: Secondary | ICD-10-CM | POA: Diagnosis not present

## 2023-01-05 DIAGNOSIS — Z1231 Encounter for screening mammogram for malignant neoplasm of breast: Secondary | ICD-10-CM | POA: Diagnosis not present

## 2023-01-17 DIAGNOSIS — R490 Dysphonia: Secondary | ICD-10-CM | POA: Diagnosis not present

## 2023-01-17 DIAGNOSIS — R498 Other voice and resonance disorders: Secondary | ICD-10-CM | POA: Diagnosis not present

## 2023-01-17 DIAGNOSIS — J383 Other diseases of vocal cords: Secondary | ICD-10-CM | POA: Diagnosis not present

## 2023-01-17 DIAGNOSIS — J384 Edema of larynx: Secondary | ICD-10-CM | POA: Diagnosis not present

## 2023-02-08 DIAGNOSIS — J383 Other diseases of vocal cords: Secondary | ICD-10-CM | POA: Diagnosis not present

## 2023-02-08 DIAGNOSIS — R498 Other voice and resonance disorders: Secondary | ICD-10-CM | POA: Diagnosis not present

## 2023-02-08 DIAGNOSIS — R490 Dysphonia: Secondary | ICD-10-CM | POA: Diagnosis not present

## 2023-02-14 DIAGNOSIS — R490 Dysphonia: Secondary | ICD-10-CM | POA: Diagnosis not present

## 2023-02-14 DIAGNOSIS — R498 Other voice and resonance disorders: Secondary | ICD-10-CM | POA: Diagnosis not present

## 2023-02-14 DIAGNOSIS — J383 Other diseases of vocal cords: Secondary | ICD-10-CM | POA: Diagnosis not present

## 2023-03-13 DIAGNOSIS — R490 Dysphonia: Secondary | ICD-10-CM | POA: Diagnosis not present

## 2023-03-13 DIAGNOSIS — R498 Other voice and resonance disorders: Secondary | ICD-10-CM | POA: Diagnosis not present

## 2023-03-13 DIAGNOSIS — J383 Other diseases of vocal cords: Secondary | ICD-10-CM | POA: Diagnosis not present

## 2023-05-26 IMAGING — CT CT CARDIAC CORONARY ARTERY CALCIUM SCORE
3 series · 14 of 20 positions shown, 16 images · non-contrast
Comparison: Report of chest radiograph 03/24/2014.

CLINICAL DATA: Elevated cholesterol.  Ex-smoker.

EXAM:
CT CARDIAC CORONARY ARTERY CALCIUM SCORE
TECHNIQUE: Non-contrast imaging through the heart was performed using
prospective ECG gating. Image post processing was performed on an
independent workstation, allowing for quantitative analysis of the
heart and coronary arteries. Note that this exam targets the heart
and the chest was not imaged in its entirety.

[Series 2: calcium scoring 2.00 qr36 bestdiast 74% hrt calciu · axial · 0.29mm/px · z∈[+1743,+1819]mm · 4 of 64 slices shown]
[im 13/64  vessel]
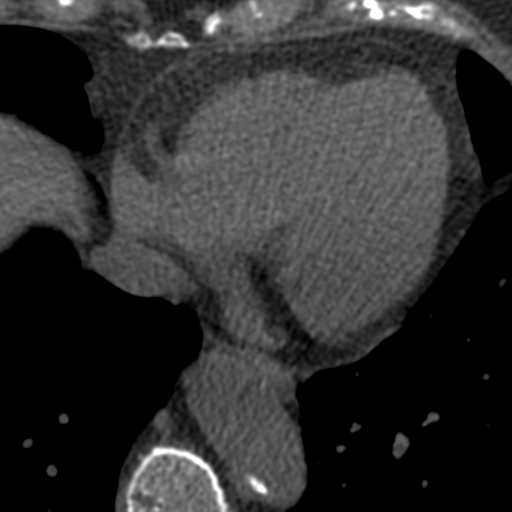
[im 26/64  vessel]
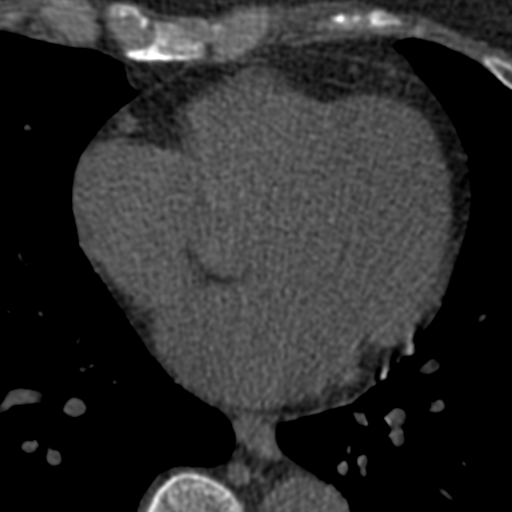
[im 38/64  vessel]
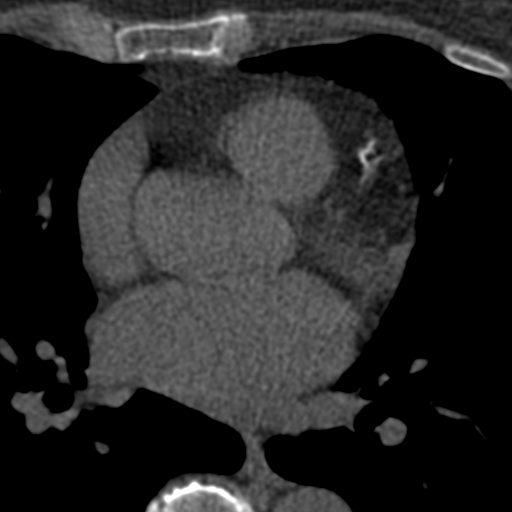
[im 51/64  vessel]
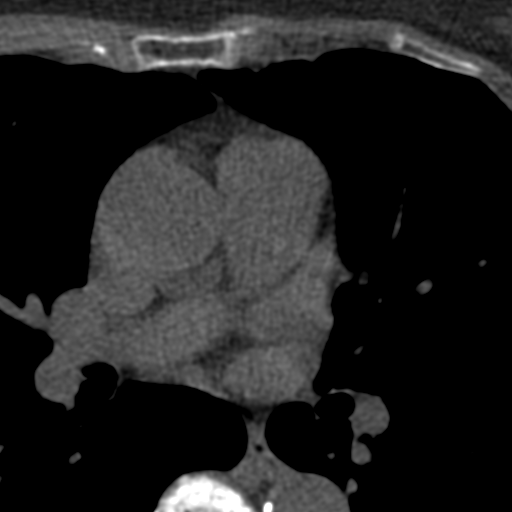

[Series 3: calcium scoring 2.00 br40 bestdiast 74% axial · axial · 0.58mm/px · z∈[+1739,+1823]mm · 5 of 64 slices shown, 7 images]
[im 11/64  vessel]
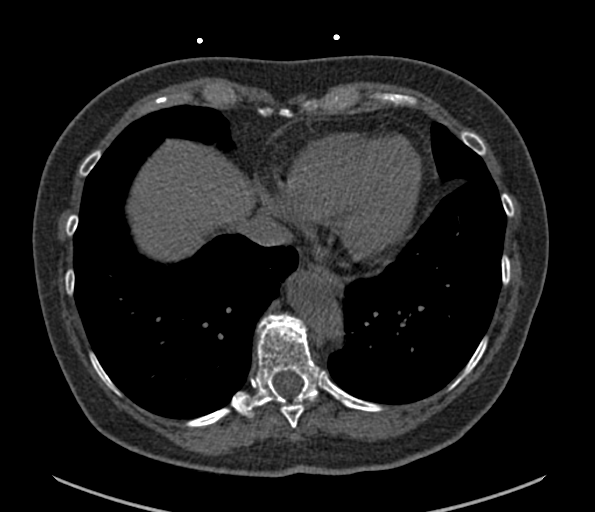
[im 11/64  lung]
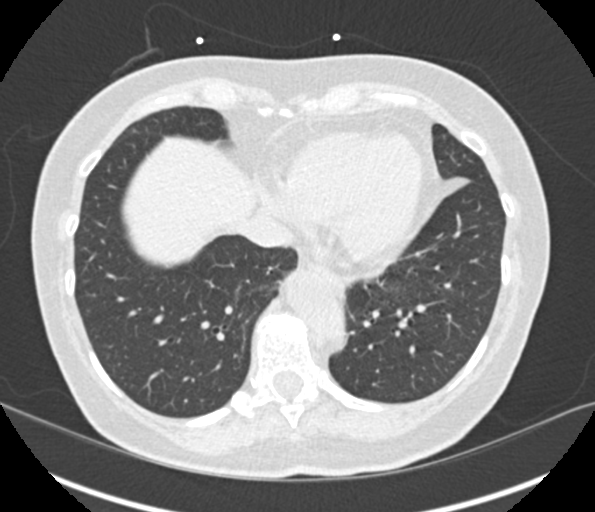
[im 22/64  vessel]
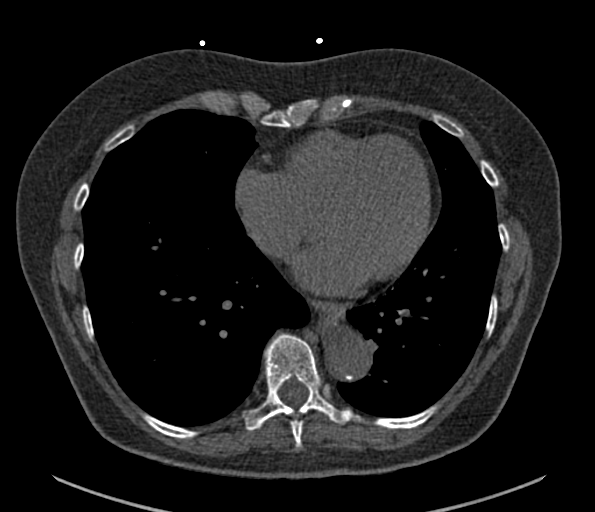
[im 32/64  vessel]
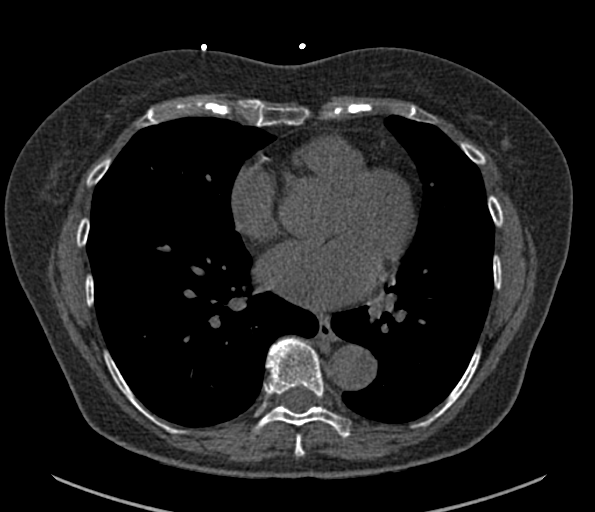
[im 43/64  vessel]
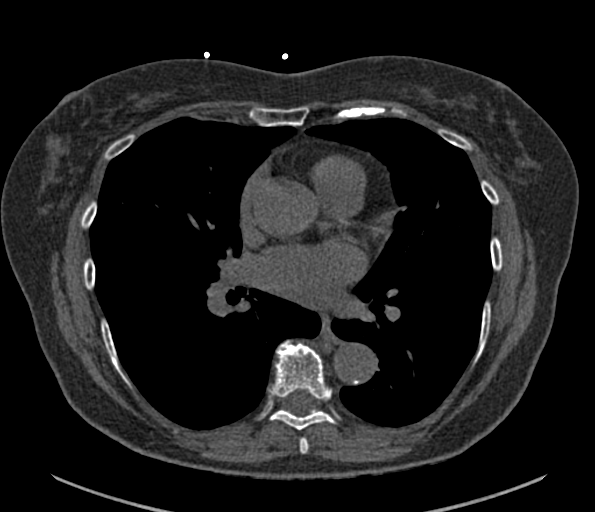
[im 53/64  vessel]
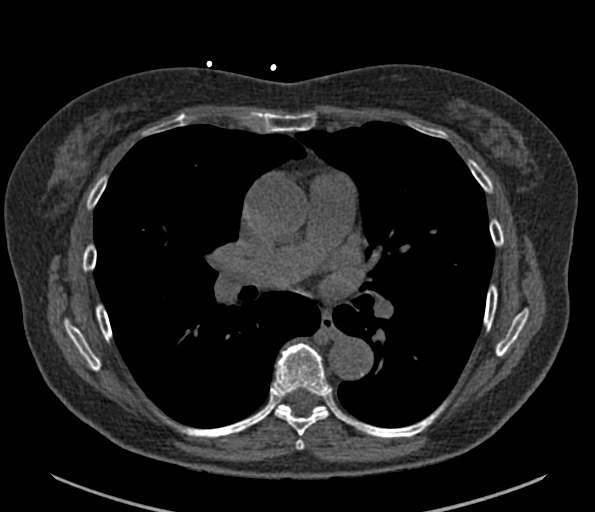
[im 53/64  lung]
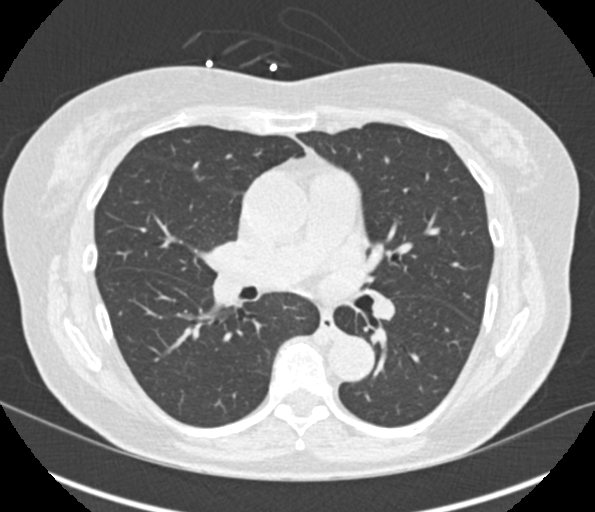

[Series 9: calcium scoring 2.00 br60 bestdiast 74% lungs · axial · 0.58mm/px · z∈[+1739,+1823]mm · 5 of 64 slices shown]
[im 11/64  vessel]
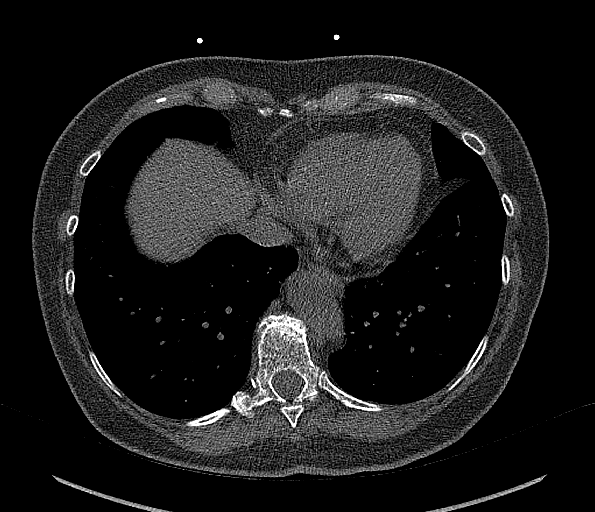
[im 22/64  vessel]
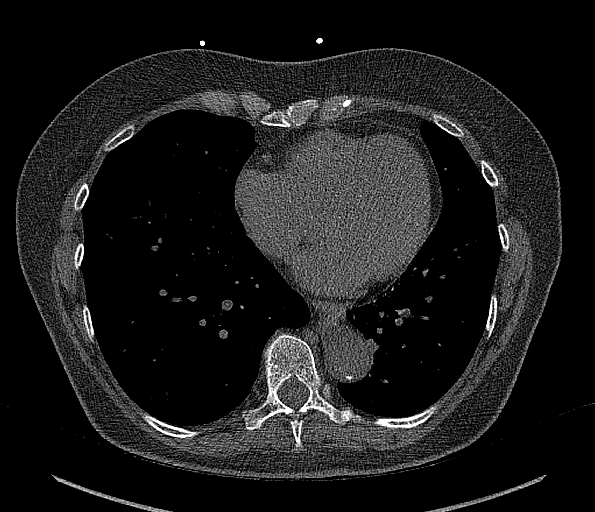
[im 32/64  vessel]
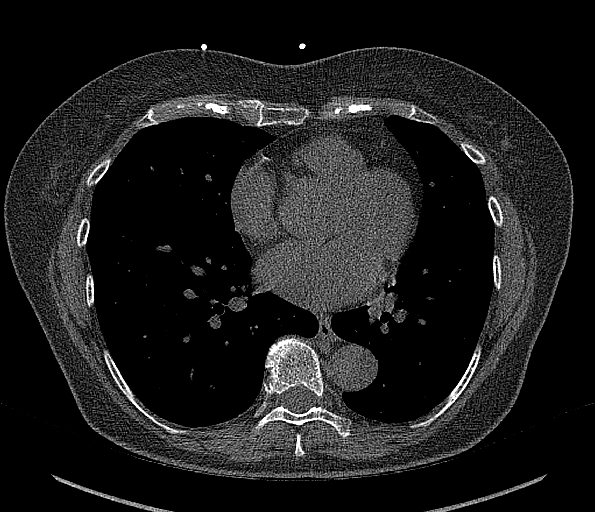
[im 43/64  vessel]
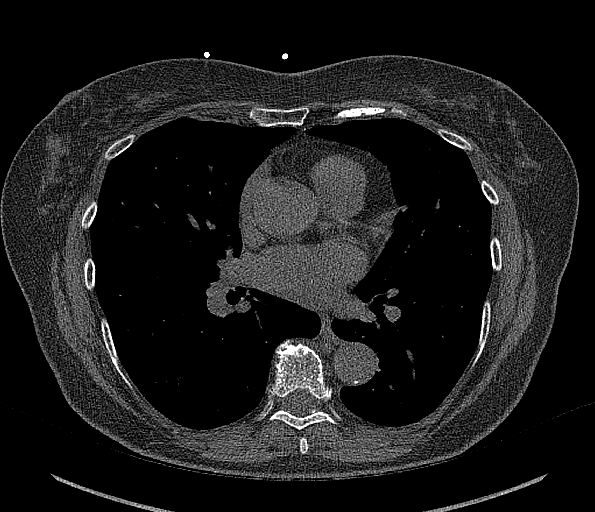
[im 53/64  vessel]
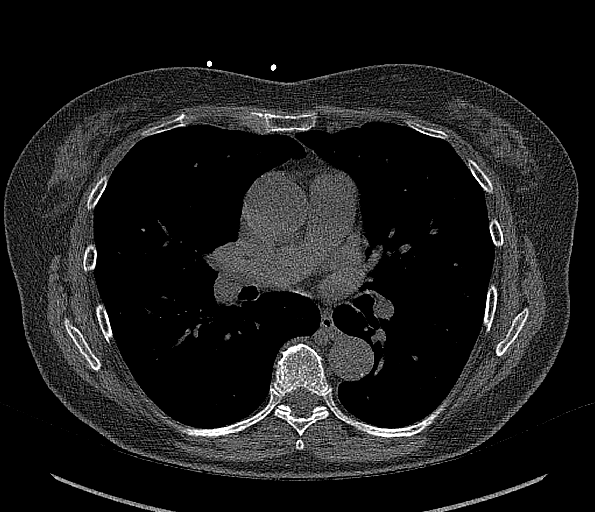

[14 of 20 positions shown; findings below may reference images not displayed]

FINDINGS: CORONARY CALCIUM SCORES:

Left Main: 0

LAD: 332

LCx:

RCA:

Total Agatston Score: 420

[HOSPITAL] percentile: 85th

AORTA MEASUREMENTS:

Ascending Aorta: 32 mm

Descending Aorta: 24 mm

OTHER FINDINGS:

Cardiovascular: Aortic atherosclerosis. Normal heart size, without
pericardial effusion.

Mediastinum/Nodes: No imaged thoracic adenopathy.

Lungs/Pleura: No pleural fluid.  Clear imaged lungs.

Upper Abdomen: Normal imaged portions of the liver.

Musculoskeletal: Lower thoracic spondylosis.
IMPRESSION: 1. Total Agatston score of 420, corresponding to 85th percentile for
age, sex, and race based cohort.
2. Aortic Atherosclerosis ([id number].M).

## 2023-06-22 DIAGNOSIS — H5212 Myopia, left eye: Secondary | ICD-10-CM | POA: Diagnosis not present

## 2023-12-26 DIAGNOSIS — I2584 Coronary atherosclerosis due to calcified coronary lesion: Secondary | ICD-10-CM | POA: Diagnosis not present

## 2023-12-26 DIAGNOSIS — I700 Atherosclerosis of aorta: Secondary | ICD-10-CM | POA: Diagnosis not present

## 2023-12-26 DIAGNOSIS — E559 Vitamin D deficiency, unspecified: Secondary | ICD-10-CM | POA: Diagnosis not present

## 2023-12-26 DIAGNOSIS — M810 Age-related osteoporosis without current pathological fracture: Secondary | ICD-10-CM | POA: Diagnosis not present

## 2023-12-26 DIAGNOSIS — L658 Other specified nonscarring hair loss: Secondary | ICD-10-CM | POA: Diagnosis not present

## 2023-12-26 DIAGNOSIS — J309 Allergic rhinitis, unspecified: Secondary | ICD-10-CM | POA: Diagnosis not present

## 2023-12-26 DIAGNOSIS — Z0000 Encounter for general adult medical examination without abnormal findings: Secondary | ICD-10-CM | POA: Diagnosis not present

## 2023-12-26 DIAGNOSIS — B351 Tinea unguium: Secondary | ICD-10-CM | POA: Diagnosis not present

## 2023-12-26 DIAGNOSIS — R498 Other voice and resonance disorders: Secondary | ICD-10-CM | POA: Diagnosis not present

## 2024-02-13 DIAGNOSIS — M810 Age-related osteoporosis without current pathological fracture: Secondary | ICD-10-CM | POA: Diagnosis not present

## 2024-03-08 DIAGNOSIS — M7989 Other specified soft tissue disorders: Secondary | ICD-10-CM | POA: Diagnosis not present

## 2024-03-28 ENCOUNTER — Ambulatory Visit

## 2024-03-28 ENCOUNTER — Other Ambulatory Visit: Payer: Self-pay

## 2024-03-28 VITALS — BP 118/86 | Ht 65.0 in | Wt 148.0 lb

## 2024-03-28 DIAGNOSIS — M25522 Pain in left elbow: Secondary | ICD-10-CM | POA: Diagnosis not present

## 2024-03-28 DIAGNOSIS — R2232 Localized swelling, mass and lump, left upper limb: Secondary | ICD-10-CM

## 2024-03-28 NOTE — Addendum Note (Signed)
 Addended by: TERESSA RAINELL BROCKS on: 03/28/2024 07:11 PM   Modules accepted: Level of Service

## 2024-03-28 NOTE — Progress Notes (Signed)
 PCP: Clarice Nottingham, MD  Subjective:   HPI: Patient is a 78 y.o. female here for a mass that she noticed on her left medial elbow roughly 1 month ago.  She says that the area is not painful to touch but she does have nerve pain that shoots up from her elbow to her armpit at nighttime.  She denies any trauma or injury to the area.  Once again, endorses that area is nontender and never brought this or to the daytime.  Pain only comes on sleeping on her left arm at nighttime.  She has not taken any medication for pain relief in the past.  Has never had mass evaluated before.  Feels like pain is getting worse over time and bothering her more frequently at night.  Denies any recent fevers, weight loss, chills.  Follows with her PCP for annual blood work and denies any recent laboratory abnormalities.  Past Medical History:  Diagnosis Date   Heart palpitations    Laryngopharyngeal reflux    Osteoporosis     Current Outpatient Medications on File Prior to Visit  Medication Sig Dispense Refill   Ascorbic Acid (VITAMIN C PO) Take by mouth.     CALCIUM-VITAMIN D PO Take by mouth daily.     Cimetidine (ACID REDUCER 200 PO) Take by mouth daily as needed.     Multiple Vitamins-Minerals (MULTIVITAMIN PO) Take by mouth daily.     Multiple Vitamins-Minerals (ZINC PO) Take by mouth.     Probiotic Product (PROBIOTIC PO) Take by mouth daily.     Vitamin D-Vitamin K (VITAMIN K2-VITAMIN D3 PO) Take by mouth.     No current facility-administered medications on file prior to visit.    No past surgical history on file.  Allergies  Allergen Reactions   Percocet [Oxycodone-Acetaminophen] Nausea And Vomiting    BP 118/86   Ht 5' 5 (1.651 m)   Wt 148 lb (67.1 kg)   LMP 08/15/1996 (Approximate)   BMI 24.63 kg/m      07/01/2021    8:29 AM  [facility]  Frequency of aerobic exercise (# of days/week) 4  Average time in minutes 60  Frequency of strengthening activities (# of  days/week) 3        No data to display              Objective:  Physical Exam:  Gen: NAD, comfortable in exam room  Ortho Exam: Poorly circumscribed mass measuring approximately 2 cm x 2 cm over left anteromedial elbow superficial to medial humeral condyle.  No erythema, induration, or ecchymoses surrounding mass.  Patient denies any tenderness to palpation.  Mass is nonmobile.  Tinel's sign directly over mass does not produce any radiation of pain similar to that described in patient HPI.  No increased temperature, no evidence of infection.  Limited Left Elbow US : Limited ultrasound of left anteromedial elbow mass performed using linear ultrasound probe.  Images obtained of both longitudinal and short axis.  See attached clinical media images.  Mass measuring roughly 2 cm x 2 cm x 1 cm.  Area is poorly circumscribed and does not have well-defined margins.  It has mixed echogenicity with areas of both hypo and hyper echogenicity.  Using Doppler there is increased vascularity within mass and mass is located just posterior to large vascular structure as noted by Doppler ultrasonography.   Assessment & Plan:  Left elbow mass of unknown origin - Physical exam and ultrasound assessment not consistent with  a typical lipoma or other benign cystic structures.  Mass of unknown etiology will require further imaging and we will proceed with obtaining a MRI of her left elbow with and without contrast for further delineation.  Patient has no known kidney disease and states her lab work is checked regularly by her PCP in [place].  She states she will fax over recent BMP/CMP for verification of healthy renal function prior to obtaining contrasted imaging.  Once MRI imaging obtained patient will return for office visit to discuss results of imaging.  Ultimately, patient may need referral for biopsy of mass.  Patient denies any need for additional pain medication at this time.  Understands treatment  plan and has no further questions or concerns at this time. - Will return to clinic in 2-3 weeks once MRI completed

## 2024-04-10 ENCOUNTER — Ambulatory Visit
Admission: RE | Admit: 2024-04-10 | Discharge: 2024-04-10 | Disposition: A | Discharge status: Home / Self Care | Source: Ambulatory Visit

## 2024-04-10 DIAGNOSIS — M25522 Pain in left elbow: Secondary | ICD-10-CM

## 2024-04-10 DIAGNOSIS — M67822 Other specified disorders of synovium, left elbow: Secondary | ICD-10-CM | POA: Diagnosis not present

## 2024-04-10 MED ORDER — GADOPICLENOL 0.5 MMOL/ML IV SOLN
7.5000 mL | Freq: Once | INTRAVENOUS | Status: AC | PRN
  Administered 2024-04-10: 7.5 mL via INTRAVENOUS

## 2024-04-24 DIAGNOSIS — M25522 Pain in left elbow: Secondary | ICD-10-CM | POA: Diagnosis not present

## 2024-04-30 ENCOUNTER — Other Ambulatory Visit (HOSPITAL_COMMUNITY): Payer: Self-pay | Admitting: Orthopedic Surgery

## 2024-04-30 DIAGNOSIS — R52 Pain, unspecified: Secondary | ICD-10-CM

## 2024-05-16 ENCOUNTER — Ambulatory Visit (HOSPITAL_COMMUNITY)
Admission: RE | Admit: 2024-05-16 | Discharge: 2024-05-16 | Disposition: A | Discharge status: Home / Self Care | Source: Ambulatory Visit | Attending: Orthopedic Surgery | Admitting: Orthopedic Surgery

## 2024-05-16 DIAGNOSIS — R52 Pain, unspecified: Secondary | ICD-10-CM | POA: Diagnosis not present
